# Patient Record
Sex: Male | Born: 1940 | Race: White | Hispanic: No | Smoking: Former smoker
Health system: Southern US, Community
[De-identification: ages and names within clinical notes are randomized; demographics above are authoritative.]

## PROBLEM LIST (undated history)

## (undated) DIAGNOSIS — R609 Edema, unspecified: Secondary | ICD-10-CM

## (undated) DIAGNOSIS — F419 Anxiety disorder, unspecified: Secondary | ICD-10-CM

## (undated) DIAGNOSIS — B0229 Other postherpetic nervous system involvement: Secondary | ICD-10-CM

## (undated) DIAGNOSIS — I639 Cerebral infarction, unspecified: Secondary | ICD-10-CM

## (undated) DIAGNOSIS — I10 Essential (primary) hypertension: Secondary | ICD-10-CM

## (undated) DIAGNOSIS — I6340 Cerebral infarction due to embolism of unspecified cerebral artery: Secondary | ICD-10-CM

## (undated) DIAGNOSIS — I480 Paroxysmal atrial fibrillation: Secondary | ICD-10-CM

## (undated) DIAGNOSIS — E7800 Pure hypercholesterolemia, unspecified: Secondary | ICD-10-CM

## (undated) DIAGNOSIS — I739 Peripheral vascular disease, unspecified: Secondary | ICD-10-CM

## (undated) DIAGNOSIS — D696 Thrombocytopenia, unspecified: Secondary | ICD-10-CM

## (undated) HISTORY — DX: Other postherpetic nervous system involvement: B02.29

## (undated) HISTORY — DX: Anxiety disorder, unspecified: F41.9

## (undated) HISTORY — DX: Thrombocytopenia, unspecified: D69.6

## (undated) HISTORY — DX: Cerebral infarction, unspecified: I63.9

## (undated) HISTORY — PX: THROAT SURGERY: SHX803

## (undated) HISTORY — DX: Essential (primary) hypertension: I10

## (undated) HISTORY — DX: Cerebral infarction due to embolism of unspecified cerebral artery: I63.40

## (undated) HISTORY — DX: Peripheral vascular disease, unspecified: I73.9

## (undated) HISTORY — DX: Paroxysmal atrial fibrillation: I48.0

## (undated) HISTORY — DX: Edema, unspecified: R60.9

## (undated) HISTORY — DX: Pure hypercholesterolemia, unspecified: E78.00

---

## 2011-03-12 ENCOUNTER — Inpatient Hospital Stay (INDEPENDENT_AMBULATORY_CARE_PROVIDER_SITE_OTHER)
Admission: RE | Admit: 2011-03-12 | Discharge: 2011-03-12 | Disposition: A | Discharge status: Home / Self Care | Payer: Federal, State, Local not specified - PPO | Source: Ambulatory Visit | Attending: Family Medicine | Admitting: Family Medicine

## 2011-03-12 ENCOUNTER — Encounter: Payer: Self-pay | Admitting: Family Medicine

## 2011-03-12 ENCOUNTER — Other Ambulatory Visit: Payer: Self-pay | Admitting: Family Medicine

## 2011-03-12 ENCOUNTER — Ambulatory Visit
Admission: RE | Admit: 2011-03-12 | Discharge: 2011-03-12 | Disposition: A | Discharge status: Home / Self Care | Payer: Federal, State, Local not specified - PPO | Source: Ambulatory Visit | Attending: Family Medicine | Admitting: Family Medicine

## 2011-03-12 DIAGNOSIS — S60219A Contusion of unspecified wrist, initial encounter: Secondary | ICD-10-CM

## 2011-03-12 DIAGNOSIS — I10 Essential (primary) hypertension: Secondary | ICD-10-CM

## 2011-03-12 HISTORY — DX: Contusion of unspecified wrist, initial encounter: S60.219A

## 2011-03-12 HISTORY — DX: Essential (primary) hypertension: I10

## 2011-03-15 ENCOUNTER — Telehealth (INDEPENDENT_AMBULATORY_CARE_PROVIDER_SITE_OTHER): Payer: Self-pay | Admitting: *Deleted

## 2011-10-19 NOTE — Progress Notes (Signed)
Summary: POSSIBLE BROKEN WRIST OR ARM? [facility] (room 2)   Vital Signs:  Patient Profile:   70 Years Old Male CC:      left wrist pain post fall off ladder yesterday Height:     68 inches Weight:      182 pounds O2 Sat:      100 % O2 treatment:    Room Air Temp:     98.6 degrees F oral Pulse rate:   69 / minute Resp:     16 per minute BP sitting:   147 / 77  (left arm) Cuff size:   regular  Pt. in pain?   yes    Location:   left wrist    Intensity:   8    Type:       throbbing  Vitals Entered By: Lajean Saver RN (March 12, 2011 10:50 AM)                   Prior Medication List:  No prior medications documented  Updated Prior Medication List: SPIRONOLACTONE 25 MG TABS (SPIRONOLACTONE) once daily AMLODIPINE BESYLATE 5 MG TABS (AMLODIPINE BESYLATE) once daily  Current Allergies: No known allergies History of Present Illness Chief Complaint: left wrist pain post fall off ladder yesterday History of Present Illness: Patient fell of his ladder last night and mostof his weight landed on his L outstretckh arm. As the night progressed the pain became worse.   Current Problems: CONTUSION OF WRIST (ICD-923.21) HYPERTENSION (ICD-401.9)   Current Meds SPIRONOLACTONE 25 MG TABS (SPIRONOLACTONE) once daily AMLODIPINE BESYLATE 5 MG TABS (AMLODIPINE BESYLATE) once daily HYDROCODONE-ACETAMINOPHEN 5-325 MG TABS (HYDROCODONE-ACETAMINOPHEN) 1 by mouth q 8hrs as needed for pain use mainly at night if needed and can cause sedation  REVIEW OF SYSTEMS Constitutional Symptoms      Denies fever, chills, night sweats, weight loss, weight gain, and fatigue.  Eyes       Denies change in vision, eye pain, eye discharge, glasses, contact lenses, and eye surgery. Ear/Nose/Throat/Mouth       Denies hearing loss/aids, change in hearing, ear pain, ear discharge, dizziness, frequent runny nose, frequent nose bleeds, sinus problems, sore throat, hoarseness, and tooth pain or bleeding.  Respiratory      Denies dry cough, productive cough, wheezing, shortness of breath, asthma, bronchitis, and emphysema/COPD.  Cardiovascular       Denies murmurs, chest pain, and tires easily with exhertion.    Gastrointestinal       Denies stomach pain, nausea/vomiting, diarrhea, constipation, blood in bowel movements, and indigestion. Genitourniary       Denies painful urination, kidney stones, and loss of urinary control. Neurological       Denies paralysis, seizures, and fainting/blackouts. Musculoskeletal       Complains of muscle pain, joint pain, decreased range of motion, and swelling.      Denies joint stiffness, redness, muscle weakness, and gout.      Comments: left wrist Skin       Denies bruising, unusual mles/lumps or sores, and hair/skin or nail changes.  Psych       Denies mood changes, temper/anger issues, anxiety/stress, speech problems, depression, and sleep problems. Other Comments: Patient fell off of a ladder last night catching himself with his left hand. No ice or OTC meds used. Swelling noted. Able to move fingers, pulses present   Past History:  Family History: Last updated: 03/12/2011 NA  Social History: Last updated: 03/12/2011 Alcohol use-no Drug use-no Current Smoker states rarely  Past Medical History: Hypertension  Past Surgical History: goiter removes Back surgery-L4  Family History: Reviewed history and no changes required. NA  Social History: Reviewed history and no changes required. Alcohol use-no Drug use-no Current Smoker states rarely Smoking Status:  current Drug Use:  no Physical Exam General appearance: well developed, well nourished, mild to moderate distress Head: normocephalic, atraumatic Extremities: tendernesss over distal forwearm on both the radial and ulnar side no tendernesss over the L anatomical snuff box Neurological: grossly intact and non-focal Skin: no obvious rashes or lesions MSE: oriented to time, place, and  person Assessment Problems:   New Problems: CONTUSION OF WRIST (ICD-923.21) HYPERTENSION (ICD-401.9)  Contusion of wrist and forearm  Patient Education: Patient and/or caregiver instructed in the following: rest, quit smoking.  Plan New Medications/Changes: HYDROCODONE-ACETAMINOPHEN 5-325 MG TABS (HYDROCODONE-ACETAMINOPHEN) 1 by mouth q 8hrs as needed for pain use mainly at night if needed and can cause sedation  #20 x 0, 03/12/2011, Hassan Rowan MD  New Orders: T-DG Forearm*L* [73090] T-DG Wrist Complete*L* [16109] Application Short Arm Splint forearm-hand  [29125] Slings- All Types [A4565] Est. Patient Level III [60454] Planning Comments:   take the vicodin mainly at night  L cocked up wrist w/thenar support and follow up in 7-14 days if not improving  ice wrist  Follow Up: Follow up with Primary Physician Follow Up: follow up in 7-14 days if not better  The patient and/or caregiver has been counseled thoroughly with regard to medications prescribed including dosage, schedule, interactions, rationale for use, and possible side effects and they verbalize understanding.  Diagnoses and expected course of recovery discussed and will return if not improved as expected or if the condition worsens. Patient and/or caregiver verbalized understanding.  Prescriptions: HYDROCODONE-ACETAMINOPHEN 5-325 MG TABS (HYDROCODONE-ACETAMINOPHEN) 1 by mouth q 8hrs as needed for pain use mainly at night if needed and can cause sedation  #20 x 0   Entered and Authorized by:   Hassan Rowan MD   Signed by:   Hassan Rowan MD on 03/12/2011   Method used:   Print then Give to Patient   RxID:   [id number]   Patient Instructions: 1)  Please place ice on forearm for 20 minutes out of 2 hours next few days 2)  Use wrist splint & sling 3)  follow up w/PCP if nopt better in 2 weeks 4)  hydrocodene for pain 5/325 1 by mouth q 6-8hrs as needed may cause sedation 5)  Please schedule a follow-up appointment  in 2 weeks. 6)  Please schedule an appointment with your primary doctor in : 7)  Tobacco is very bad for your health and your loved ones! You Should stop smoking!. 8)  Stop Smoking Tips: Choose a Quit date. Cut down before the Quit date. decide what you will do as a substitute when you feel the urge to smoke(gum,toothpick,exercise).  Medication Administration  Injection # 1:    Medication: Ketorolac-Toradol 15mg     Route: IM    Site: RUOQ gluteus    Exp Date: 01/14/2013    Lot #: [id number]    Mfr: Hospira    Comments: 60 mgs given    Patient tolerated injection without complications    Given by: Emilio Math (March 12, 2011 11:55 AM)  Orders Added: 1)  T-DG Forearm*L* [73090] 2)  T-DG Wrist Complete*L* [73110] 3)  Application Short Arm Splint forearm-hand  [29125] 4)  Slings- All Types [A4565] 5)  Est. Patient Level III [30865]     Medication Administration  Injection # 1:    Medication: Ketorolac-Toradol 15mg     Route: IM    Site: RUOQ gluteus    Exp Date: 01/14/2013    Lot #: [id number]    Mfr: Hospira    Comments: 60 mgs given    Patient tolerated injection without complications    Given by: Emilio Math (March 12, 2011 11:55 AM)  Orders Added: 1)  T-DG Forearm*L* [73090] 2)  T-DG Wrist Complete*L* [73110] 3)  Application Short Arm Splint forearm-hand  [29125] 4)  Slings- All Types [A4565] 5)  Est. Patient Level III [96045]

## 2011-10-19 NOTE — Telephone Encounter (Signed)
  Phone Note Outgoing Call Call back at [facility] Phone [phone]   Call placed by: Lajean Saver RN,  March 15, 2011 1:20 PM Call placed to: Patient Summary of Call: Callback: No answer. Message left to call with questions/concerns

## 2012-02-19 DIAGNOSIS — I493 Ventricular premature depolarization: Secondary | ICD-10-CM

## 2012-02-19 DIAGNOSIS — R002 Palpitations: Secondary | ICD-10-CM

## 2012-02-19 HISTORY — DX: Palpitations: R00.2

## 2012-02-19 HISTORY — DX: Ventricular premature depolarization: I49.3

## 2012-12-01 IMAGING — CR DG WRIST COMPLETE 3+V*L*
2 series · 2 of 2 positions shown · non-contrast
Comparison: None.

CLINICAL DATA: Wrist pain and swelling after fall

LEFT WRIST - COMPLETE 3+ VIEW

[view not recorded (1 of 2)]
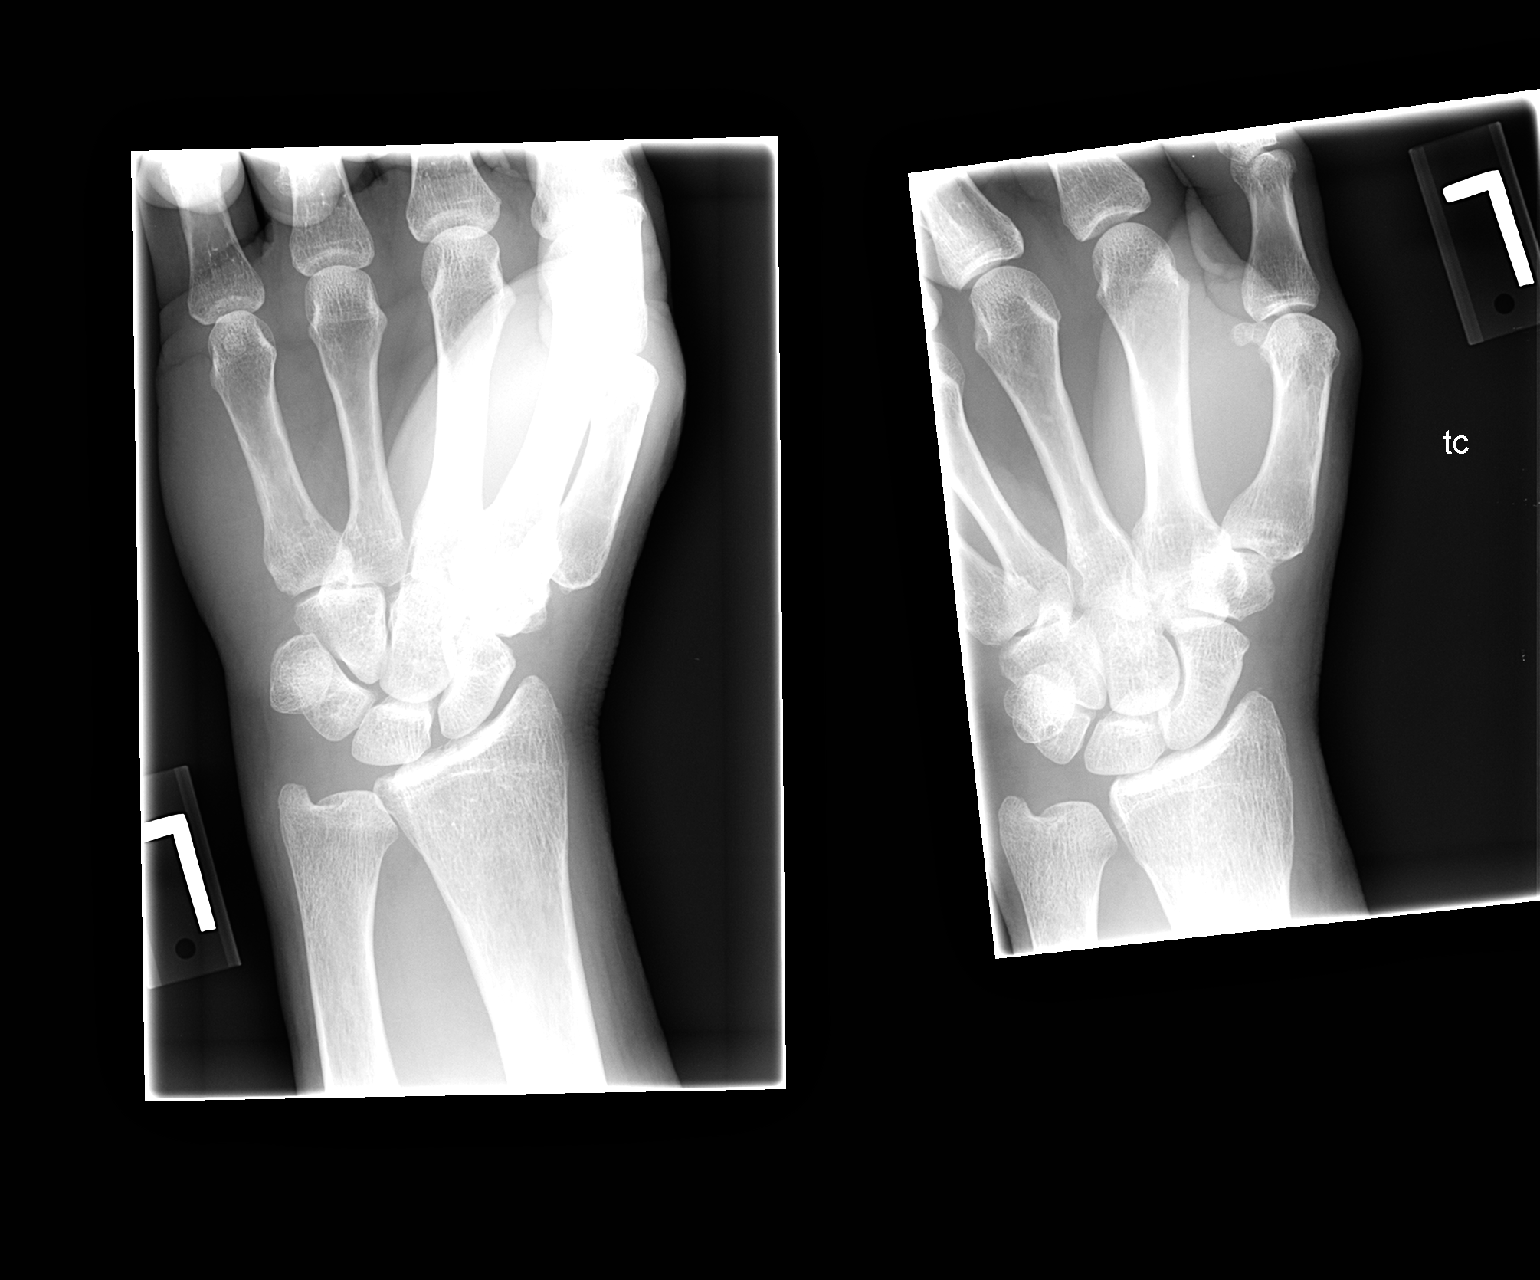

[view not recorded (2 of 2)]
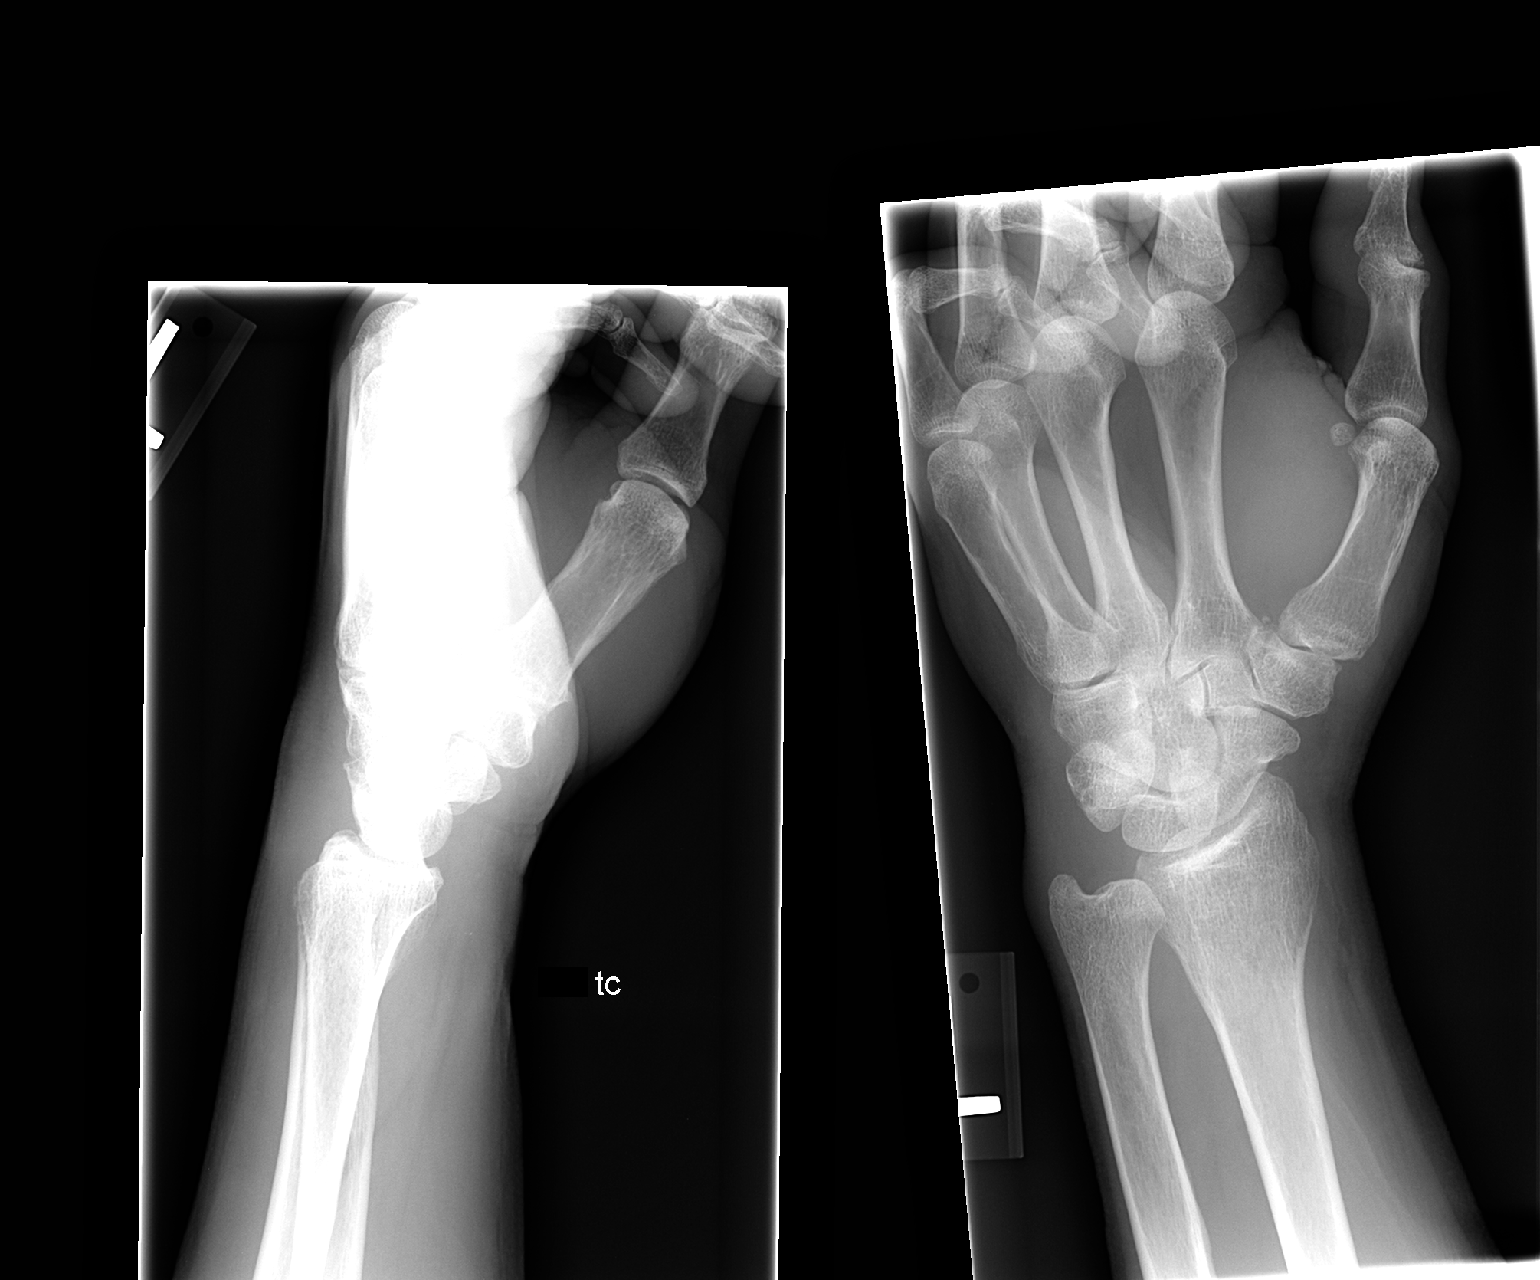

[2 of 2 positions shown; findings below may reference images not displayed]

FINDINGS: There is no evidence of acute bone, joint, or soft tissue
abnormality.  Incidental note is ulnar minus variance.  Early
osteoarthritis is noted at the first CMC joint.
IMPRESSION: No evidence of acute fracture or dislocation.

## 2016-04-01 DIAGNOSIS — E871 Hypo-osmolality and hyponatremia: Secondary | ICD-10-CM | POA: Insufficient documentation

## 2016-04-01 HISTORY — DX: Hypo-osmolality and hyponatremia: E87.1

## 2016-07-31 DIAGNOSIS — Z8521 Personal history of malignant neoplasm of larynx: Secondary | ICD-10-CM | POA: Insufficient documentation

## 2016-07-31 DIAGNOSIS — C329 Malignant neoplasm of larynx, unspecified: Secondary | ICD-10-CM

## 2016-07-31 HISTORY — DX: Malignant neoplasm of larynx, unspecified: C32.9

## 2017-04-09 DIAGNOSIS — M5136 Other intervertebral disc degeneration, lumbar region: Secondary | ICD-10-CM

## 2017-04-09 DIAGNOSIS — M51369 Other intervertebral disc degeneration, lumbar region without mention of lumbar back pain or lower extremity pain: Secondary | ICD-10-CM | POA: Insufficient documentation

## 2017-04-09 HISTORY — DX: Other intervertebral disc degeneration, lumbar region without mention of lumbar back pain or lower extremity pain: M51.369

## 2017-04-09 HISTORY — DX: Other intervertebral disc degeneration, lumbar region: M51.36

## 2017-11-26 ENCOUNTER — Encounter: Payer: Federal, State, Local not specified - PPO | Admitting: Sports Medicine

## 2019-06-15 DIAGNOSIS — R972 Elevated prostate specific antigen [PSA]: Secondary | ICD-10-CM

## 2019-06-15 HISTORY — DX: Elevated prostate specific antigen (PSA): R97.20

## 2020-01-18 DIAGNOSIS — H278 Other specified disorders of lens: Secondary | ICD-10-CM | POA: Insufficient documentation

## 2020-01-18 DIAGNOSIS — H25812 Combined forms of age-related cataract, left eye: Secondary | ICD-10-CM

## 2020-01-18 HISTORY — DX: Other specified disorders of lens: H27.8

## 2020-01-18 HISTORY — DX: Combined forms of age-related cataract, left eye: H25.812

## 2020-03-18 ENCOUNTER — Telehealth: Payer: Self-pay | Admitting: Cardiology

## 2020-03-18 NOTE — Telephone Encounter (Signed)
Patient's daughter states she is concerned that the patient will not be able to complete all of his paperwork without assistance. She is requesting to discuss the amount of paperwork that needs to be completed prior to new patient appointment scheduled for 03/21/20 with Dr. Agustin Cree. Please call.

## 2020-03-18 NOTE — Telephone Encounter (Signed)
Called and spoke to patient's daughter informed her the main form he has to fill out is dpr form she reports this is ok and he will not have trouble with this. He is also able to speak about his history but reading or writing it is difficult for him.

## 2020-03-21 ENCOUNTER — Ambulatory Visit: Payer: Federal, State, Local not specified - PPO | Admitting: Cardiology

## 2020-03-27 ENCOUNTER — Other Ambulatory Visit: Payer: Self-pay

## 2020-03-27 ENCOUNTER — Ambulatory Visit (INDEPENDENT_AMBULATORY_CARE_PROVIDER_SITE_OTHER): Payer: Federal, State, Local not specified - PPO | Admitting: Cardiology

## 2020-03-27 ENCOUNTER — Encounter: Payer: Self-pay | Admitting: Cardiology

## 2020-03-27 DIAGNOSIS — R609 Edema, unspecified: Secondary | ICD-10-CM | POA: Insufficient documentation

## 2020-03-27 DIAGNOSIS — I6340 Cerebral infarction due to embolism of unspecified cerebral artery: Secondary | ICD-10-CM | POA: Insufficient documentation

## 2020-03-27 DIAGNOSIS — I10 Essential (primary) hypertension: Secondary | ICD-10-CM | POA: Diagnosis not present

## 2020-03-27 DIAGNOSIS — M62838 Other muscle spasm: Secondary | ICD-10-CM

## 2020-03-27 DIAGNOSIS — E7800 Pure hypercholesterolemia, unspecified: Secondary | ICD-10-CM | POA: Insufficient documentation

## 2020-03-27 DIAGNOSIS — I480 Paroxysmal atrial fibrillation: Secondary | ICD-10-CM | POA: Insufficient documentation

## 2020-03-27 DIAGNOSIS — J309 Allergic rhinitis, unspecified: Secondary | ICD-10-CM | POA: Insufficient documentation

## 2020-03-27 DIAGNOSIS — F419 Anxiety disorder, unspecified: Secondary | ICD-10-CM

## 2020-03-27 DIAGNOSIS — G4700 Insomnia, unspecified: Secondary | ICD-10-CM

## 2020-03-27 DIAGNOSIS — I6930 Unspecified sequelae of cerebral infarction: Secondary | ICD-10-CM

## 2020-03-27 DIAGNOSIS — I739 Peripheral vascular disease, unspecified: Secondary | ICD-10-CM | POA: Diagnosis not present

## 2020-03-27 DIAGNOSIS — D696 Thrombocytopenia, unspecified: Secondary | ICD-10-CM

## 2020-03-27 DIAGNOSIS — I639 Cerebral infarction, unspecified: Secondary | ICD-10-CM | POA: Insufficient documentation

## 2020-03-27 HISTORY — DX: Unspecified sequelae of cerebral infarction: I69.30

## 2020-03-27 HISTORY — DX: Insomnia, unspecified: G47.00

## 2020-03-27 HISTORY — DX: Allergic rhinitis, unspecified: J30.9

## 2020-03-27 HISTORY — DX: Other muscle spasm: M62.838

## 2020-03-27 NOTE — Progress Notes (Signed)
Cardiology Office Note:    Date:  03/27/2020   ID:  Stephen Russell, DOB [birth year], MRN [id number]  PCP:  Houston Siren., MD  Cardiologist:  Jenne Campus, MD    Referring MD: Houston Siren., MD   No chief complaint on file. Stroke  History of Present Illness:    Stephen Russell is a 79 y.o. male with past medical history significant for hypertension, dyslipidemia, paroxysmal atrial fibrillation, not anticoagulated.  In September 2020 he presented to hospital with dysarthria as well as left-sided weakness.Marland Kitchen  He was beyond the window of TPA, however embolectomy of middle cerebral artery M2 has been done with almost complete resolution of his symptoms.  He was discharged with anticoagulation as well as statin therapy and he is here to follow-up with [place].  Overall he seems to be doing very well he recovered from his stroke almost completely.  He is able to work in the garden he does have some property like to work over there [employer] and Overall Doing Excellently Denies of Any Palpitations No Chest Pain Tightness Squeezing Pressure Burning Chest No Shortness of Breath Overall He Considers Himself As an Excellent Shape.  Past Medical History:  Diagnosis Date   Anxiety    Benign essential hypertension    Cerebrovascular accident (CVA) due to embolism of cerebral artery (HCC)    CVA (cerebral vascular accident) ([place])    Edema    PAD (peripheral artery disease) (HCC)    Paroxysmal A-fib (HCC)    Post herpetic neuralgia    Pure hypercholesterolemia    Thrombocytopenia ([place])     Past Surgical History:  Procedure Laterality Date   THROAT SURGERY      Current Medications: Current Meds  Medication Sig   ALPRAZolam (XANAX) 0.5 MG tablet Take 0.5 mg by mouth 2 (two) times daily as needed.    apixaban (ELIQUIS) 2.5 MG TABS tablet TAKE TWO TABLETS BY MOUTH EVERY MORNING AND TAKE ONE TABLET BY MOUTH EVERY EVENING   atenolol (TENORMIN) 50 MG tablet Take 50 mg by mouth 2 (two)  times daily.   Chlorophyll-Thymol 3-0.6 MG TABS    Cholecalciferol 25 MCG (1000 UT) tablet Take by mouth.   co-enzyme Q-10 30 MG capsule    cyanocobalamin 1000 MCG tablet Take by mouth.   INOSITOL PO Take by mouth.   Multiple Vitamin (MULTI-VITAMIN) tablet Take by mouth.   Multiple Vitamins-Minerals (ZINC PO) Take by mouth.   rosuvastatin (CRESTOR) 5 MG tablet Take 1 tablet by mouth  as directed   spironolactone (ALDACTONE) 25 MG tablet Take 25 mg by mouth daily as needed.   [DISCONTINUED] diclofenac (FLECTOR) 1.3 % PTCH Place onto the skin.     Allergies:   Citalopram hydrobromide, Hydrochlorothiazide, Oxycodone-acetaminophen, Pravastatin, Quetiapine, and Ivp dye  [iodinated diagnostic agents]   Social History   Socioeconomic History   Marital status: Divorced    Spouse name: Not on file   Number of children: Not on file   Years of education: Not on file   Highest education level: Not on file  Occupational History   Not on file  Tobacco Use   Smoking status: Former Smoker   Smokeless tobacco: Never Used  Substance and Sexual Activity   Alcohol use: Not Currently   Drug use: Never   Sexual activity: Not on file  Other Topics Concern   Not on file  Social History Narrative   Not on file   Social Determinants of Health   Financial Resource Strain:  Difficulty of Paying Living Expenses:   Food Insecurity:    Worried About Charity fundraiser in the Last Year:    Arboriculturist in the Last Year:   Transportation Needs:    Film/video editor (Medical):    Lack of Transportation (Non-Medical):   Physical Activity:    Days of Exercise per Week:    Minutes of Exercise per Session:   Stress:    Feeling of Stress :   Social Connections:    Frequency of Communication with Friends and Family:    Frequency of Social Gatherings with Friends and Family:    Attends Religious Services:    Active Member of Clubs or Organizations:     Attends Archivist Meetings:    Marital Status:      Family History: The patient's family history includes Heart failure in his mother; Stroke in his mother; Tongue cancer in his brother. ROS:   Please see the history of present illness.    All 14 point review of systems negative except as described per history of present illness  EKGs/Labs/Other Studies Reviewed:    I did review echocardiogram from other hospital which was done in September showing normal left ventricle ejection fraction, normal left atrial size  EKG done today showed atrial fibrillation, controlled ventricular rate, normal QS complex duration morphology.  Recent Labs: No results found for requested labs within last 8760 hours.  Recent Lipid Panel No results found for: CHOL, TRIG, HDL, CHOLHDL, VLDL, LDLCALC, LDLDIRECT  Physical Exam:    VS:  Ht 5\' 7"  (1.702 m)    Wt 186 lb (84.4 kg)    BMI 29.13 kg/m     Wt Readings from Last 3 Encounters:  03/27/20 186 lb (84.4 kg)  03/12/11 182 lb (82.6 kg)     GEN:  Well nourished, well developed in no acute distress HEENT: Normal NECK: No JVD; No carotid bruits LYMPHATICS: No lymphadenopathy CARDIAC: RRR, no murmurs, no rubs, no gallops RESPIRATORY:  Clear to auscultation without rales, wheezing or rhonchi  ABDOMEN: Soft, non-tender, non-distended MUSCULOSKELETAL:  No edema; No deformity  SKIN: Warm and dry LOWER EXTREMITIES: no swelling NEUROLOGIC:  Alert and oriented x 3 PSYCHIATRIC:  Normal affect   ASSESSMENT:    1. Anxiety   2. Benign essential hypertension   3. PAD (peripheral artery disease) (HCC)   4. Paroxysmal A-fib ([place])   5. Pure hypercholesterolemia   6. 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 Paroxysmal atrial fibrillation.  His EKG today showed atrial fibrillation documentation from other hospital showed atrial flutter.  He tells me that he  checked his blood pressure and blood pressure monitor cannot tell him when his heart rate is irregular he said about 30% of time this is exactly what he is getting out of the monitor.  I think the plan will be to put Zio patch on him to see how much atrial fibrillation he truly have.  If this is paroxysmal atrial fibrillation may benefit from antiarrhythmic.  However, if you find out that this is persistent atrial fibrillation since he is completely asymptomatic I doubt if there be any benefits of getting him back to normal rhythm.  He is anticoagulated with his CHA2DS2-VASc equals 5.  He need to continue. 2. Dyslipidemia: He does have difficulty tolerating statin.  He should be considered very high risks with recent  CVA which was ischemic.  Ideally we need to get his cholesterol LDL below 70 and he need to be on high intensity statin.  Obviously he cannot tolerate this medication now he takes only 10 mg of Crestor and only quarter of the tablet.  I spoke to him about options for this situation option included PCSK9 agent.  He did try already Zetia and making her feel awful.  I also offered him referral to lipid clinic.  He said he want to think it over. 3. Dyslipidemia: Stable problem. 4. 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 5. We did talk about healthy lifestyle need to exercise which we have no difficulty doing good diet.   Medication Adjustments/Labs and Tests Ordered: Current medicines are reviewed at length with the patient today.  Concerns regarding medicines are outlined above.  No orders of the defined types were placed in this encounter.  Medication changes: No orders of the defined types were placed in this encounter.   Signed, Park Liter, MD, [facility] 03/27/2020 1:51 PM    [place]

## 2020-03-27 NOTE — Patient Instructions (Signed)
Medication Instructions:  Your physician recommends that you continue on your current medications as directed. Please refer to the Current Medication list given to you today.  *If you need a refill on your cardiac medications before your next appointment, please call your pharmacy*   Lab Work: None If you have labs (blood work) drawn today and your tests are completely normal, you will receive your results only by: . MyChart Message (if you have MyChart) OR . A paper copy in the mail If you have any lab test that is abnormal or we need to change your treatment, we will call you to review the results.   Testing/Procedures: A zio monitor was ordered today. It will remain on for 7 days. You will then return monitor and event diary in provided box. It takes 1-2 weeks for report to be downloaded and returned to us. We will call you with the results. If monitor falls off or has orange flashing light, please call Zio for further instructions.      Follow-Up: At [facility], you and your health needs are our priority.  As part of our continuing mission to provide you with exceptional heart care, we have created designated Provider Care Teams.  These Care Teams include your primary Cardiologist (physician) and Advanced Practice Providers (APPs -  Physician Assistants and Nurse Practitioners) who all work together to provide you with the care you need, when you need it.  We recommend signing up for the patient portal called "MyChart".  Sign up information is provided on this After Visit Summary.  MyChart is used to connect with patients for Virtual Visits (Telemedicine).  Patients are able to view lab/test results, encounter notes, upcoming appointments, etc.  Non-urgent messages can be sent to your provider as well.   To learn more about what you can do with MyChart, go to [email or web address].    Your next appointment:   1 month(s)  The format for your next appointment:   In  Person  Provider:   Robert Krasowski, MD   Other Instructions   

## 2020-03-31 ENCOUNTER — Ambulatory Visit (INDEPENDENT_AMBULATORY_CARE_PROVIDER_SITE_OTHER): Payer: Federal, State, Local not specified - PPO

## 2020-03-31 DIAGNOSIS — I480 Paroxysmal atrial fibrillation: Secondary | ICD-10-CM

## 2020-05-07 ENCOUNTER — Telehealth: Payer: Self-pay | Admitting: Cardiology

## 2020-05-07 MED ORDER — METOPROLOL SUCCINATE ER 50 MG PO TB24
50.0000 mg | ORAL_TABLET | Freq: Every day | ORAL | 3 refills | Status: DC
Start: 2020-05-07 — End: 2020-06-21

## 2020-05-07 NOTE — Telephone Encounter (Signed)
Per Dr. Agustin Cree, Multiple pauses noted on the monior. Please stop Atenolol, start Metoprolop succinate 50 mg po qd  Another Zio patch 1 week after metoprolol started   Called patient with results. Patient will stop atenolol and start metoprolol. Patient has an appointment on 05/22/20 with Dr. Agustin Cree, patient stated he would just get zio patch then. Will send to Dr. Agustin Cree and his nurse so they are aware.

## 2020-05-07 NOTE — Telephone Encounter (Signed)
Patient returning Hayley's call in regards to monitor results.

## 2020-05-22 ENCOUNTER — Encounter: Payer: Self-pay | Admitting: Cardiology

## 2020-05-22 ENCOUNTER — Other Ambulatory Visit: Payer: Self-pay

## 2020-05-22 ENCOUNTER — Ambulatory Visit (INDEPENDENT_AMBULATORY_CARE_PROVIDER_SITE_OTHER): Payer: Federal, State, Local not specified - PPO | Admitting: Cardiology

## 2020-05-22 VITALS — BP 167/144 | HR 99 | Ht 67.0 in | Wt 180.0 lb

## 2020-05-22 DIAGNOSIS — I493 Ventricular premature depolarization: Secondary | ICD-10-CM | POA: Diagnosis not present

## 2020-05-22 DIAGNOSIS — I480 Paroxysmal atrial fibrillation: Secondary | ICD-10-CM

## 2020-05-22 DIAGNOSIS — I6930 Unspecified sequelae of cerebral infarction: Secondary | ICD-10-CM

## 2020-05-22 DIAGNOSIS — I10 Essential (primary) hypertension: Secondary | ICD-10-CM

## 2020-05-22 DIAGNOSIS — F419 Anxiety disorder, unspecified: Secondary | ICD-10-CM

## 2020-05-22 NOTE — Progress Notes (Signed)
Cardiology Office Note:    Date:  05/22/2020   ID:  Stephen Russell, DOB 04/18/1941, MRN [id number]  PCP:  Houston Siren., MD  Cardiologist:  Jenne Campus, MD    Referring MD: Houston Siren., MD   No chief complaint on file. I am doing fine  History of Present Illness:    Stephen Russell is a 79 y.o. male  with past medical history significant for hypertension, dyslipidemia, paroxysmal atrial fibrillation, not anticoagulated.  In September 2020 he presented to hospital with dysarthria as well as left-sided weakness.Marland Kitchen  He was beyond the window of TPA, however embolectomy of middle cerebral artery M2 has been done with almost complete resolution of his symptoms.  He was discharged with anticoagulation as well as statin therapy and he is here to follow-up with [place].   Comes today to my office for follow-up overall seems to be doing well.  He was late today in the office and he is very upset about that.  What we discover on the monitor when he wear monitor for atrial fibrillation is the fact that he has pauses up to 4.7 seconds.  His atenolol has been withdrawn he was put on small dose of beta-blocker from metoprolol he seems to be doing better however he did not have any symptoms of dizziness or passing out.  I will ask you to wear another Zio patch to see if his ventricular rate is better controlled.  Overall he is doing well denies have any palpitations no tightness squeezing pressure burning chest no swelling of lower extremities  Past Medical History:  Diagnosis Date  . Anxiety   . Benign essential hypertension   . Cerebrovascular accident (CVA) due to embolism of cerebral artery ([place])   . CVA (cerebral vascular accident) ([place])   . Edema   . PAD (peripheral artery disease) ([place])   . Paroxysmal A-fib ([place])   . Post herpetic neuralgia   . Pure hypercholesterolemia   . Thrombocytopenia ([place])     Past Surgical History:  Procedure Laterality Date  . THROAT SURGERY      Current  Medications: Current Meds  Medication Sig  . ALPRAZolam (XANAX) 0.5 MG tablet Take 0.5 mg by mouth 2 (two) times daily as needed.   Marland Kitchen apixaban (ELIQUIS) 2.5 MG TABS tablet TAKE TWO TABLETS BY MOUTH EVERY MORNING AND TAKE ONE TABLET BY MOUTH EVERY EVENING  . Chlorophyll-Thymol 3-0.6 MG TABS   . Cholecalciferol 25 MCG (1000 UT) tablet Take by mouth.  . co-enzyme Q-10 30 MG capsule   . cyanocobalamin 1000 MCG tablet Take by mouth.  . INOSITOL PO Take by mouth.  . metoprolol succinate (TOPROL-XL) 50 MG 24 hr tablet Take 1 tablet (50 mg total) by mouth daily. Take with or immediately following a meal.  . Multiple Vitamin (MULTI-VITAMIN) tablet Take by mouth.  . Multiple Vitamins-Minerals (ZINC PO) Take by mouth.  . rosuvastatin (CRESTOR) 5 MG tablet Take 1 tablet by mouth  as directed  . spironolactone (ALDACTONE) 25 MG tablet Take 25 mg by mouth daily as needed.     Allergies:   Citalopram hydrobromide, Hydrochlorothiazide, Oxycodone-acetaminophen, Pravastatin, Quetiapine, and Ivp dye  [iodinated diagnostic agents]   Social History   Socioeconomic History  . Marital status: Divorced    Spouse name: Not on file  . Number of children: Not on file  . Years of education: Not on file  . Highest education level: Not on file  Occupational History  . Not on file  Tobacco Use  . Smoking status: Former Research scientist (life sciences)  . Smokeless tobacco: Never Used  Substance and Sexual Activity  . Alcohol use: Not Currently  . Drug use: Never  . Sexual activity: Not on file  Other Topics Concern  . Not on file  Social History Narrative  . Not on file   Social Determinants of Health   Financial Resource Strain:   . Difficulty of Paying Living Expenses:   Food Insecurity:   . Worried About Charity fundraiser in the Last Year:   . Arboriculturist in the Last Year:   Transportation Needs:   . Film/video editor (Medical):   Marland Kitchen Lack of Transportation (Non-Medical):   Physical Activity:   . Days of  Exercise per Week:   . Minutes of Exercise per Session:   Stress:   . Feeling of Stress :   Social Connections:   . Frequency of Communication with Friends and Family:   . Frequency of Social Gatherings with Friends and Family:   . Attends Religious Services:   . Active Member of Clubs or Organizations:   . Attends Archivist Meetings:   Marland Kitchen Marital Status:      Family History: The patient's family history includes Heart failure in his mother; Stroke in his mother; Tongue cancer in his brother. ROS:   Please see the history of present illness.    All 14 point review of systems negative except as described per history of present illness  EKGs/Labs/Other Studies Reviewed:      Recent Labs: No results found for requested labs within last 8760 hours.  Recent Lipid Panel No results found for: CHOL, TRIG, HDL, CHOLHDL, VLDL, LDLCALC, LDLDIRECT  Physical Exam:    VS:  BP (!) 167/144 (BP Location: Left Arm, Patient Position: Sitting, Cuff Size: Normal)   Pulse 99   Ht 5\' 7"  (1.702 m)   Wt 180 lb (81.6 kg)   SpO2 100%   BMI 28.19 kg/m     Wt Readings from Last 3 Encounters:  05/22/20 180 lb (81.6 kg)  03/27/20 186 lb (84.4 kg)  03/12/11 182 lb (82.6 kg)     GEN:  Well nourished, well developed in no acute distress HEENT: Normal NECK: No JVD; No carotid bruits LYMPHATICS: No lymphadenopathy CARDIAC: Irregular irregular, no murmurs, no rubs, no gallops RESPIRATORY:  Clear to auscultation without rales, wheezing or rhonchi  ABDOMEN: Soft, non-tender, non-distended MUSCULOSKELETAL:  No edema; No deformity  SKIN: Warm and dry LOWER EXTREMITIES: no swelling NEUROLOGIC:  Alert and oriented x 3 PSYCHIATRIC:  Normal affect   ASSESSMENT:    1. Paroxysmal A-fib ([place])   2. Benign essential hypertension   3. PVC's (premature ventricular contractions)   4. Late effect of cerebrovascular accident (CVA)   5. Anxiety    PLAN:    In order of problems listed  above:  1. Paroxysmal atrial fibrillation.  I did review his monitor which actually show persistent atrial fibrillation.  We had a long discussion today about potentially getting his heart back to normal rhythm I discussed pros and cons antiarrhythmic therapy and the rhythm control strategy versus rate control.  With this is that he is completely asymptomatic with atrial fibrillation, he need to be anticoagulated anyway he said he would like to think over about what he wants to do.we will talk about this more.  In the meantime I will ask him to wear another Zio patch just for about 48 hours to see  what the control of atrial fibrillation is. 2. Benign essential hypertension blood pressure was elevated when he came but will recheck it before he leaves the office. 3. PVCs seems to be doing well from that point review denies having any issues. 4. Late effect of CVA doing well recovered quite nicely after stroke. 5. Anxiety present carries some significant role here.   Medication Adjustments/Labs and Tests Ordered: Current medicines are reviewed at length with the patient today.  Concerns regarding medicines are outlined above.  Orders Placed This Encounter  Procedures  . HOLTER MONITOR - 48 HOUR   Medication changes: No orders of the defined types were placed in this encounter.   Signed, Park Liter, MD, [facility] 05/22/2020 11:28 AM    [place]

## 2020-05-22 NOTE — Patient Instructions (Addendum)
Medication Instructions:  Your physician recommends that you continue on your current medications as directed. Please refer to the Current Medication list given to you today.  *If you need a refill on your cardiac medications before your next appointment, please call your pharmacy*   Lab Work: NONE If you have labs (blood work) drawn today and your tests are completely normal, you will receive your results only by: Marland Kitchen MyChart Message (if you have MyChart) OR . A paper copy in the mail If you have any lab test that is abnormal or we need to change your treatment, we will call you to review the results.   Testing/Procedures: Your physician has recommended that you wear a ZIO PATCH holter monitor. Holter monitors are medical devices that record the heart's electrical activity. Doctors most often use these monitors to diagnose arrhythmias. Arrhythmias are problems with the speed or rhythm of the heartbeat. The monitor is a small, portable device. You can wear one while you do your normal daily activities. This is usually used to diagnose what is causing palpitations/syncope (passing out).   Follow-Up: At [facility], you and your health needs are our priority.  As part of our continuing mission to provide you with exceptional heart care, we have created designated Provider Care Teams.  These Care Teams include your primary Cardiologist (physician) and Advanced Practice Providers (APPs -  Physician Assistants and Nurse Practitioners) who all work together to provide you with the care you need, when you need it.  We recommend signing up for the patient portal called "MyChart".  Sign up information is provided on this After Visit Summary.  MyChart is used to connect with patients for Virtual Visits (Telemedicine).  Patients are able to view lab/test results, encounter notes, upcoming appointments, etc.  Non-urgent messages can be sent to your provider as well.   To learn more about what you can do  with MyChart, go to [email or web address].    Your next appointment:   2 month(s)  The format for your next appointment:   In Person  Provider:   Jenne Campus, MD

## 2020-05-30 DIAGNOSIS — I480 Paroxysmal atrial fibrillation: Secondary | ICD-10-CM

## 2020-06-06 ENCOUNTER — Ambulatory Visit (INDEPENDENT_AMBULATORY_CARE_PROVIDER_SITE_OTHER): Payer: Federal, State, Local not specified - PPO

## 2020-06-06 DIAGNOSIS — I480 Paroxysmal atrial fibrillation: Secondary | ICD-10-CM | POA: Diagnosis not present

## 2020-06-20 ENCOUNTER — Telehealth: Payer: Self-pay | Admitting: Cardiology

## 2020-06-20 NOTE — Telephone Encounter (Signed)
° ° °  Received page from [facility] stating that the patient had a 60 second run of AF with RVR which then returned to rate controlled AF. Asked to have strips sent to office for MD review. Message forwarded to primary cardiologist.   Kathyrn Drown NP-C [place] Pager: [phone]

## 2020-06-21 MED ORDER — METOPROLOL SUCCINATE ER 25 MG PO TB24
25.0000 mg | ORAL_TABLET | Freq: Every day | ORAL | 11 refills | Status: DC
Start: 2020-06-21 — End: 2021-05-05

## 2020-06-21 NOTE — Telephone Encounter (Signed)
Returned call to pr.  Left another message for pt to call back.

## 2020-06-21 NOTE — Telephone Encounter (Signed)
Call placed to pt.  Dr. Agustin Cree received strips from [facility].  Per Dr. Agustin Cree, pt needs to reduce Metoprolol to XL 25 mg daily and have pt come into see Dr. Agustin Cree.  1st available is 6/25 in the [facility] office, if pt is asymptomatic. Ok to double book pt.  I have left a message for to call back.

## 2020-06-21 NOTE — Telephone Encounter (Signed)
Returned call to pt.  He has been made aware of the monitor strip.  He stated that he is asymptomatic and felt nothing while he wore the monitor.  Pt actually reports working harder than usual out in the hot sun while he wore the zio patch and felt nothing unusual.    Pt has been scheduled to see Dr. Agustin Cree 07/10/20 @ 10:00.   Pt wanted to report to Dr. Agustin Cree that every since he started the Metoprolol, he has been a different person, and daughter even notes.  Pt looks better and act better.

## 2020-06-21 NOTE — Telephone Encounter (Signed)
Follow up ° °Pt returned call  °

## 2020-06-28 ENCOUNTER — Telehealth: Payer: Self-pay | Admitting: Emergency Medicine

## 2020-06-28 NOTE — Telephone Encounter (Signed)
Left message for patient to return call regarding monitor report.

## 2020-07-05 NOTE — Telephone Encounter (Signed)
Let another message for patient to return call.

## 2020-07-08 NOTE — Telephone Encounter (Signed)
Follow up  Pt is returning call from [facility]

## 2020-07-10 ENCOUNTER — Ambulatory Visit: Payer: Federal, State, Local not specified - PPO | Admitting: Cardiology

## 2020-07-10 ENCOUNTER — Other Ambulatory Visit: Payer: Self-pay

## 2020-07-10 ENCOUNTER — Encounter: Payer: Self-pay | Admitting: Cardiology

## 2020-07-10 VITALS — BP 158/102 | HR 98 | Ht 67.0 in | Wt 178.0 lb

## 2020-07-10 DIAGNOSIS — I6930 Unspecified sequelae of cerebral infarction: Secondary | ICD-10-CM | POA: Diagnosis not present

## 2020-07-10 DIAGNOSIS — I4821 Permanent atrial fibrillation: Secondary | ICD-10-CM | POA: Insufficient documentation

## 2020-07-10 DIAGNOSIS — I10 Essential (primary) hypertension: Secondary | ICD-10-CM | POA: Diagnosis not present

## 2020-07-10 HISTORY — DX: Permanent atrial fibrillation: I48.21

## 2020-07-10 NOTE — Telephone Encounter (Signed)
Patient was seen in office today. Concerns were addressed.

## 2020-07-10 NOTE — Patient Instructions (Signed)

## 2020-07-10 NOTE — Progress Notes (Signed)
Cardiology Office Note:    Date:  07/10/2020   ID:  Cason Luffman, DOB 1941/04/08, MRN [id number]  PCP:  Houston Siren., MD  Cardiologist:  Jenne Campus, MD    Referring MD: Houston Siren., MD   No chief complaint on file. I am doing great  History of Present Illness:    Stephen Russell is a 79 y.o. male past medical history significant for permanent atrial fibrillation, history of CVA requiring thrombectomy, he was without anticoagulation at the time, essential hypertension, comes today to my office to discuss results of his test.  He did wear a monitor which showed tachybradycardia phenomenon.  He will have fast ventricular rate of atrial fibrillation and then pauses to 5 seconds.  He is completely asymptomatic with this he denies having any dizziness passing out syncope.  Overall he is feeling great and kind of surprised that he is here.  He still working very hard have no difficulty doing it.  His daughter is with [place] during the conversation.  Past Medical History:  Diagnosis Date  . Anxiety   . Benign essential hypertension   . Cerebrovascular accident (CVA) due to embolism of cerebral artery ([place])   . CVA (cerebral vascular accident) ([place])   . Edema   . PAD (peripheral artery disease) ([place])   . Paroxysmal A-fib ([place])   . Post herpetic neuralgia   . Pure hypercholesterolemia   . Thrombocytopenia ([place])     Past Surgical History:  Procedure Laterality Date  . THROAT SURGERY      Current Medications: Current Meds  Medication Sig  . ALPRAZolam (XANAX) 0.5 MG tablet Take 0.5 mg by mouth 2 (two) times daily as needed.   Marland Kitchen apixaban (ELIQUIS) 2.5 MG TABS tablet TAKE TWO TABLETS BY MOUTH EVERY MORNING AND TAKE ONE TABLET BY MOUTH EVERY EVENING  . Chlorophyll-Thymol 3-0.6 MG TABS   . Cholecalciferol 25 MCG (1000 UT) tablet Take by mouth.  . co-enzyme Q-10 30 MG capsule   . cyanocobalamin 1000 MCG tablet Take by mouth.  . INOSITOL PO Take by mouth.  . metoprolol  succinate (TOPROL XL) 25 MG 24 hr tablet Take 1 tablet (25 mg total) by mouth daily.  . Multiple Vitamin (MULTI-VITAMIN) tablet Take by mouth.  . Multiple Vitamins-Minerals (ZINC PO) Take by mouth.  . rosuvastatin (CRESTOR) 5 MG tablet Take 1 tablet by mouth  as directed  . spironolactone (ALDACTONE) 25 MG tablet Take 25 mg by mouth daily as needed.  . Zinc 22.5 MG TABS Take 1 tablet by mouth daily.     Allergies:   Citalopram hydrobromide, Hydrochlorothiazide, Oxycodone-acetaminophen, Pravastatin, Quetiapine, and Ivp dye  [iodinated diagnostic agents]   Social History   Socioeconomic History  . Marital status: Divorced    Spouse name: Not on file  . Number of children: Not on file  . Years of education: Not on file  . Highest education level: Not on file  Occupational History  . Not on file  Tobacco Use  . Smoking status: Former Research scientist (life sciences)  . Smokeless tobacco: Never Used  Substance and Sexual Activity  . Alcohol use: Not Currently  . Drug use: Never  . Sexual activity: Not on file  Other Topics Concern  . Not on file  Social History Narrative  . Not on file   Social Determinants of Health   Financial Resource Strain:   . Difficulty of Paying Living Expenses: Not on file  Food Insecurity:   . Worried About [employer] of  Food in the Last Year: Not on file  . Ran Out of Food in the Last Year: Not on file  Transportation Needs:   . Lack of Transportation (Medical): Not on file  . Lack of Transportation (Non-Medical): Not on file  Physical Activity:   . Days of Exercise per Week: Not on file  . Minutes of Exercise per Session: Not on file  Stress:   . Feeling of Stress : Not on file  Social Connections:   . Frequency of Communication with Friends and Family: Not on file  . Frequency of Social Gatherings with Friends and Family: Not on file  . Attends Religious Services: Not on file  . Active Member of Clubs or Organizations: Not on file  . Attends Archivist  Meetings: Not on file  . Marital Status: Not on file     Family History: The patient's family history includes Heart failure in his mother; Stroke in his mother; Tongue cancer in his brother. ROS:   Please see the history of present illness.    All 14 point review of systems negative except as described per history of present illness  EKGs/Labs/Other Studies Reviewed:      Recent Labs: No results found for requested labs within last 8760 hours.  Recent Lipid Panel No results found for: CHOL, TRIG, HDL, CHOLHDL, VLDL, LDLCALC, LDLDIRECT  Physical Exam:    VS:  BP (!) 158/102   Pulse 98   Ht 5\' 7"  (1.702 m)   Wt 178 lb (80.7 kg)   SpO2 100%   BMI 27.88 kg/m     Wt Readings from Last 3 Encounters:  07/10/20 178 lb (80.7 kg)  05/22/20 180 lb (81.6 kg)  03/27/20 186 lb (84.4 kg)     GEN:  Well nourished, well developed in no acute distress HEENT: Normal NECK: No JVD; No carotid bruits LYMPHATICS: No lymphadenopathy CARDIAC: Irregular irregular, no murmurs, no rubs, no gallops RESPIRATORY:  Clear to auscultation without rales, wheezing or rhonchi  ABDOMEN: Soft, non-tender, non-distended MUSCULOSKELETAL:  No edema; No deformity  SKIN: Warm and dry LOWER EXTREMITIES: no swelling NEUROLOGIC:  Alert and oriented x 3 PSYCHIATRIC:  Normal affect   ASSESSMENT:    1. Essential hypertension   2. Permanent atrial fibrillation ([place])   3. Late effect of cerebrovascular accident (CVA)   4. Benign essential hypertension    PLAN:    In order of problems listed above:  1. Tachybradycardia phenomenon.  I did spend a great deal of time explained to what that means.  I offer consideration of pacemaker.  He said he want to think about it.  He is completely asymptomatic after admit.  He is taking right now 50 mg metoprolol he said he feels significantly better with metoprolol rather than atenolol.  He is anticoagulated which I will continue. 2. Essential hypertension: Blood pressure  elevated today but he is excited about what we are doing.  He brought me blood pressure measurements from home his blood pressure is much better at home sufficiently controlled.  We will continue present management. 3. Late effect of CVA doing well from that point review.   Medication Adjustments/Labs and Tests Ordered: Current medicines are reviewed at length with the patient today.  Concerns regarding medicines are outlined above.  No orders of the defined types were placed in this encounter.  Medication changes: No orders of the defined types were placed in this encounter.   Signed, Park Liter, MD, [facility] 07/10/2020 10:37  AM    [place]

## 2020-08-23 ENCOUNTER — Telehealth: Payer: Self-pay | Admitting: Cardiology

## 2020-08-23 DIAGNOSIS — I4821 Permanent atrial fibrillation: Secondary | ICD-10-CM

## 2020-08-23 NOTE — Telephone Encounter (Signed)
Prescription refill request for Eliquis received. Indication: A Fib Last office visit:05/22/20 Scr: 0.97 Age: 79 Weight: 80.7kg  Request received is for Eliquis 2.5mg  two tablets in the morning and 1 tablet in the evening.  Patient's dose should be 5mg  BID.  Routing to MD for input

## 2020-08-23 NOTE — Telephone Encounter (Signed)
*  STAT* If patient is at the pharmacy, call can be transferred to refill team.   1. Which medications need to be refilled? (please list name of each medication and dose if known) apixaban (ELIQUIS) 2.5 MG TABS tablet  2. Which pharmacy/location (including street and city if local pharmacy) is medication to be sent to? [facility] [place], [place]  3. Do they need a 30 day or 90 day supply? 90 day

## 2020-08-24 NOTE — Telephone Encounter (Signed)
No, please switch to proper dose which is 5 mg twice daily, thank you

## 2020-08-26 MED ORDER — APIXABAN 5 MG PO TABS: 5.0000 mg | ORAL_TABLET | Freq: Two times a day (BID) | ORAL | 1 refills | Status: DC

## 2020-08-26 NOTE — Telephone Encounter (Signed)
Changing dose to Eliquis 5mg  BID.  Called patient and left message on machine.

## 2020-11-29 ENCOUNTER — Ambulatory Visit: Payer: Federal, State, Local not specified - PPO | Admitting: Cardiology

## 2021-01-17 ENCOUNTER — Other Ambulatory Visit: Payer: Self-pay

## 2021-01-17 DIAGNOSIS — B0229 Other postherpetic nervous system involvement: Secondary | ICD-10-CM | POA: Insufficient documentation

## 2021-01-23 ENCOUNTER — Ambulatory Visit (INDEPENDENT_AMBULATORY_CARE_PROVIDER_SITE_OTHER): Payer: Federal, State, Local not specified - PPO

## 2021-01-23 ENCOUNTER — Other Ambulatory Visit: Payer: Self-pay

## 2021-01-23 ENCOUNTER — Ambulatory Visit: Payer: Federal, State, Local not specified - PPO | Admitting: Cardiology

## 2021-01-23 ENCOUNTER — Encounter: Payer: Self-pay | Admitting: Cardiology

## 2021-01-23 VITALS — BP 130/80 | HR 53 | Ht 67.0 in | Wt 177.0 lb

## 2021-01-23 DIAGNOSIS — I10 Essential (primary) hypertension: Secondary | ICD-10-CM | POA: Diagnosis not present

## 2021-01-23 DIAGNOSIS — E7800 Pure hypercholesterolemia, unspecified: Secondary | ICD-10-CM

## 2021-01-23 DIAGNOSIS — I6930 Unspecified sequelae of cerebral infarction: Secondary | ICD-10-CM

## 2021-01-23 DIAGNOSIS — I4821 Permanent atrial fibrillation: Secondary | ICD-10-CM | POA: Diagnosis not present

## 2021-01-23 MED ORDER — APIXABAN 5 MG PO TABS: 5.0000 mg | ORAL_TABLET | Freq: Two times a day (BID) | ORAL | 1 refills | Status: DC

## 2021-01-23 NOTE — Patient Instructions (Signed)
Medication Instructions:  Your physician has recommended you make the following change in your medication:   Make sure you are taking eliquis 5 mg twice daily   *If you need a refill on your cardiac medications before your next appointment, please call your pharmacy*   Lab Work: None If you have labs (blood work) drawn today and your tests are completely normal, you will receive your results only by: Marland Kitchen MyChart Message (if you have MyChart) OR . A paper copy in the mail If you have any lab test that is abnormal or we need to change your treatment, we will call you to review the results.   Testing/Procedures: Your physician has requested that you have an echocardiogram. Echocardiography is a painless test that uses sound waves to create images of your heart. It provides your doctor with information about the size and shape of your heart and how well your heart's chambers and valves are working. This procedure takes approximately one hour. There are no restrictions for this procedure.  A zio monitor was ordered today. It will remain on for 7 days. You will then return monitor and event diary in provided box. It takes 1-2 weeks for report to be downloaded and returned to [place]. We will call you with the results. If monitor falls off or has orange flashing light, please call Zio for further instructions.      Follow-Up: At [facility], you and your health needs are our priority.  As part of our continuing mission to provide you with exceptional heart care, we have created designated Provider Care Teams.  These Care Teams include your primary Cardiologist (physician) and Advanced Practice Providers (APPs -  Physician Assistants and Nurse Practitioners) who all work together to provide you with the care you need, when you need it.  We recommend signing up for the patient portal called "MyChart".  Sign up information is provided on this After Visit Summary.  MyChart is used to connect with  patients for Virtual Visits (Telemedicine).  Patients are able to view lab/test results, encounter notes, upcoming appointments, etc.  Non-urgent messages can be sent to your provider as well.   To learn more about what you can do with MyChart, go to [email or web address].    Your next appointment:   3 month(s)  The format for your next appointment:   In Person  Provider:   Jenne Campus, MD   Other Instructions   Echocardiogram An echocardiogram is a test that uses sound waves (ultrasound) to produce images of the heart. Images from an echocardiogram can provide important information about:  Heart size and shape.  The size and thickness and movement of your heart's walls.  Heart muscle function and strength.  Heart valve function or if you have stenosis. Stenosis is when the heart valves are too narrow.  If blood is flowing backward through the heart valves (regurgitation).  A tumor or infectious growth around the heart valves.  Areas of heart muscle that are not working well because of poor blood flow or injury from a heart attack.  Aneurysm detection. An aneurysm is a weak or damaged part of an artery wall. The wall bulges out from the normal force of blood pumping through the body. Tell a health care provider about:  Any allergies you have.  All medicines you are taking, including vitamins, herbs, eye drops, creams, and over-the-counter medicines.  Any blood disorders you have.  Any surgeries you have had.  Any medical conditions you have.  Whether you are pregnant or may be pregnant. What are the risks? Generally, this is a safe test. However, problems may occur, including an allergic reaction to dye (contrast) that may be used during the test. What happens before the test? No specific preparation is needed. You may eat and drink normally. What happens during the test?  You will take off your clothes from the waist up and put on a hospital  gown.  Electrodes or electrocardiogram (ECG)patches may be placed on your chest. The electrodes or patches are then connected to a device that monitors your heart rate and rhythm.  You will lie down on a table for an ultrasound exam. A gel will be applied to your chest to help sound waves pass through your skin.  A handheld device, called a transducer, will be pressed against your chest and moved over your heart. The transducer produces sound waves that travel to your heart and bounce back (or "echo" back) to the transducer. These sound waves will be captured in real-time and changed into images of your heart that can be viewed on a video monitor. The images will be recorded on a computer and reviewed by your health care provider.  You may be asked to change positions or hold your breath for a short time. This makes it easier to get different views or better views of your heart.  In some cases, you may receive contrast through an IV in one of your veins. This can improve the quality of the pictures from your heart. The procedure may vary among health care providers and hospitals.   What can I expect after the test? You may return to your normal, everyday life, including diet, activities, and medicines, unless your health care provider tells you not to do that. Follow these instructions at home:  It is up to you to get the results of your test. Ask your health care provider, or the department that is doing the test, when your results will be ready.  Keep all follow-up visits. This is important. Summary  An echocardiogram is a test that uses sound waves (ultrasound) to produce images of the heart.  Images from an echocardiogram can provide important information about the size and shape of your heart, heart muscle function, heart valve function, and other possible heart problems.  You do not need to do anything to prepare before this test. You may eat and drink normally.  After the  echocardiogram is completed, you may return to your normal, everyday life, unless your health care provider tells you not to do that. This information is not intended to replace advice given to you by your health care provider. Make sure you discuss any questions you have with your health care provider. Document Revised: 06/25/2020 Document Reviewed: 06/25/2020 Elsevier Patient Education  2021 [employer].

## 2021-01-23 NOTE — Progress Notes (Signed)
Cardiology Office Note:    Date:  01/23/2021   ID:  Stephen Russell, DOB January 07, 1941, MRN [id number]  PCP:  Houston Siren., MD  Cardiologist:  Jenne Campus, MD    Referring MD: Houston Siren., MD   Chief Complaint  Patient presents with  . Follow-up  I am doing fine  History of Present Illness:    Stephen Russell is a 80 y.o. 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  Another issue that he had was long pauses on the monitor while he was taking atenolol.  Overall he is doing well.  Denies have any chest pain tightness squeezing pressure burning chest no swelling of lower extremities no dizziness no passing out.  He does not have any nightmares.  Does feel things that he discovered during the visit for several he is taking Eliquis is very strange way he takes 2 tablets of 2.5 mg in the morning two-point 5 in the afternoon and then 2.5 at evening time he cannot explain to me why he is taking this that way but clearly we need to simplify his that.  Another issue is the fact that his metoprolol has been increased 150 mg daily and I am worried about him having significant pauses, luckily it looks like he is asymptomatic.  He is trying to be active trying to work and do things and managing quite well.  Past Medical History:  Diagnosis Date  . Allergic rhinitis 03/27/2020  . Anxiety   . Benign essential hypertension   . Cerebrovascular accident (CVA) due to embolism of cerebral artery ([place])   . Combined forms of age-related cataract of left eye 01/18/2020  . Contusion of wrist 03/12/2011   Qualifier: Diagnosis of  By: Alveta Heimlich MD, Cornelia Copa    . CVA (cerebral vascular accident) ([place])   . Degenerative disc disease, lumbar 04/09/2017  . Edema   . Elevated PSA 06/15/2019  . Essential hypertension 03/12/2011   Qualifier: Diagnosis of  By: Alveta Heimlich MD, Cornelia Copa    .  Hyponatremia 04/01/2016  . Insomnia 03/27/2020  . 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  . 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 Muscle spasm 03/27/2020  . PAD (peripheral artery disease) ([place])   . Palpitations 02/19/2012  . Paroxysmal A-fib ([place])   . Permanent atrial fibrillation ([place]) 07/10/2020  . Post herpetic neuralgia   . Pure hypercholesterolemia   . PVC's (premature ventricular contractions) 02/19/2012  . Thrombocytopenia ([place])   . 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 THROAT SURGERY      Current Medications: Current Meds  Medication Sig  . ALPRAZolam (XANAX) 0.5 MG tablet Take 0.5 mg by mouth 2 (two) times daily as needed for anxiety or sleep.  Marland Kitchen apixaban (ELIQUIS) 5 MG TABS tablet Take 1 tablet (5 mg total) by mouth 2 (two) times daily.  . Azelastine HCl 0.15 % SOLN Place 2 sprays into the nose daily.  . Chlorophyll-Thymol 3-0.6 MG TABS   . Cholecalciferol 25 MCG (1000 UT) tablet Take by mouth.  . co-enzyme Q-10 30 MG capsule   . cyanocobalamin 1000 MCG tablet Take by mouth.  . fluticasone (FLONASE) 50 MCG/ACT nasal spray Place 2 sprays into both nostrils daily.  . INOSITOL PO Take by mouth.  . metoprolol succinate (TOPROL XL) 25 MG 24 hr tablet Take 1 tablet (25 mg total)  by mouth daily. (Patient taking differently: Take 150 mg by mouth daily.)  . mometasone (ELOCON) 0.1 % cream Apply 1 application topically daily.  . Multiple Vitamin (MULTI-VITAMIN) tablet Take by mouth.  . Multiple Vitamins-Minerals (ZINC PO) Take by mouth.  . rosuvastatin (CRESTOR) 5 MG tablet Take 1 tablet by mouth  as directed  . spironolactone (ALDACTONE) 25 MG tablet Take 25 mg by mouth daily as needed (fluids).     Allergies:   Citalopram hydrobromide, Hydrochlorothiazide, Oxycodone-acetaminophen, Pravastatin, Quetiapine, and Ivp dye  [iodinated diagnostic agents]   Social History   Socioeconomic History  . Marital  status: Divorced    Spouse name: Not on file  . Number of children: Not on file  . Years of education: Not on file  . Highest education level: Not on file  Occupational History  . Not on file  Tobacco Use  . Smoking status: Former Research scientist (life sciences)  . Smokeless tobacco: Never Used  Substance and Sexual Activity  . Alcohol use: Not Currently  . Drug use: Never  . Sexual activity: Not on file  Other Topics Concern  . Not on file  Social History Narrative  . Not on file   Social Determinants of Health   Financial Resource Strain: Not on file  Food Insecurity: Not on file  Transportation Needs: Not on file  Physical Activity: Not on file  Stress: Not on file  Social Connections: Not on file     Family History: The patient's family history includes Heart failure in his mother; Stroke in his mother; Tongue cancer in his brother. ROS:   Please see the history of present illness.    All 14 point review of systems negative except as described per history of present illness  EKGs/Labs/Other Studies Reviewed:      Recent Labs: No results found for requested labs within last 8760 hours.  Recent Lipid Panel No results found for: CHOL, TRIG, HDL, CHOLHDL, VLDL, LDLCALC, LDLDIRECT  Physical Exam:    VS:  BP 130/80 (BP Location: Right Arm, Patient Position: Sitting)   Pulse (!) 53   Ht 5\' 7"  (1.702 m)   Wt 177 lb (80.3 kg)   SpO2 98%   BMI 27.72 kg/m     Wt Readings from Last 3 Encounters:  01/23/21 177 lb (80.3 kg)  07/10/20 178 lb (80.7 kg)  05/22/20 180 lb (81.6 kg)     GEN:  Well nourished, well developed in no acute distress HEENT: Normal NECK: No JVD; No carotid bruits LYMPHATICS: No lymphadenopathy CARDIAC: Irregularly irregular, no murmurs, no rubs, no gallops RESPIRATORY:  Clear to auscultation without rales, wheezing or rhonchi  ABDOMEN: Soft, non-tender, non-distended MUSCULOSKELETAL:  No edema; No deformity  SKIN: Warm and dry LOWER EXTREMITIES: no  swelling NEUROLOGIC:  Alert and oriented x 3 PSYCHIATRIC:  Normal affect   ASSESSMENT:    1. Permanent atrial fibrillation ([place])   2. Late effect of cerebrovascular accident (CVA)   3. Pure hypercholesterolemia   4. Benign essential hypertension    PLAN:    In order of problems listed above:  1. Permanent atrial fibrillation he is anticoagulated but will simplify his management.  He need to be taken 5 mg of Eliquis twice daily.  I will ask him to wear Zio patch to see if he gets significant bradycardia during the night.  So far I do not see indications for pacemaker but this is something that will be reconsidered after monitor will be placed.  As a part of  evaluation I will ask him also to have an echocardiogram to assess left ventricle ejection fraction more importantly look at the size of atrium. 2. Late effect of CVA he seems to doing very well from that point review.  Decreased anticoagulation, chads 2 vascular equals 5. 3. Dyslipidemia, he is taking low-dose of Crestor which I will continue for now will call primary care physician to get his fasting lipid profile. 4. Benign essential hypertension blood pressure seems to be well controlled continue present management.   Medication Adjustments/Labs and Tests Ordered: Current medicines are reviewed at length with the patient today.  Concerns regarding medicines are outlined above.  No orders of the defined types were placed in this encounter.  Medication changes: No orders of the defined types were placed in this encounter.   Signed, Park Liter, MD, [facility] 01/23/2021 9:32 AM    [place]

## 2021-01-23 NOTE — Addendum Note (Signed)
Addended by: Senaida Ores on: 01/23/2021 09:42 AM   Modules accepted: Orders

## 2021-02-07 ENCOUNTER — Telehealth: Payer: Self-pay

## 2021-02-07 NOTE — Telephone Encounter (Signed)
-----   Message from Park Liter, MD sent at 02/06/2021 11:40 AM EDT ----- Atrial fibrillation noted on the monitor ultra recording with relatively controlled ventricular rate.  Continue present management

## 2021-02-07 NOTE — Telephone Encounter (Signed)
Patient notified of results and verbalized understanding.  

## 2021-03-03 ENCOUNTER — Other Ambulatory Visit: Payer: Self-pay

## 2021-03-03 ENCOUNTER — Ambulatory Visit (HOSPITAL_BASED_OUTPATIENT_CLINIC_OR_DEPARTMENT_OTHER)
Admission: RE | Admit: 2021-03-03 | Discharge: 2021-03-03 | Disposition: A | Discharge status: Home / Self Care | Payer: Federal, State, Local not specified - PPO | Source: Ambulatory Visit | Attending: Cardiology | Admitting: Cardiology

## 2021-03-03 DIAGNOSIS — I10 Essential (primary) hypertension: Secondary | ICD-10-CM | POA: Insufficient documentation

## 2021-03-03 DIAGNOSIS — E7800 Pure hypercholesterolemia, unspecified: Secondary | ICD-10-CM | POA: Insufficient documentation

## 2021-03-03 DIAGNOSIS — I6930 Unspecified sequelae of cerebral infarction: Secondary | ICD-10-CM | POA: Diagnosis present

## 2021-03-03 DIAGNOSIS — I4821 Permanent atrial fibrillation: Secondary | ICD-10-CM | POA: Diagnosis not present

## 2021-03-03 LAB — ECHOCARDIOGRAM COMPLETE
AR max vel: 2.6 cm2
AV Area VTI: 2.58 cm2
AV Area mean vel: 2.32 cm2
AV Mean grad: 2 mmHg
AV Peak grad: 4.4 mmHg
Ao pk vel: 1.05 m/s
Area-P 1/2: 3.99 cm2
MV VTI: 1.87 cm2
P 1/2 time: 505 msec
S' Lateral: 2.67 cm

## 2021-03-04 ENCOUNTER — Telehealth: Payer: Self-pay | Admitting: Cardiology

## 2021-03-04 NOTE — Telephone Encounter (Signed)
VM left for pt to call back

## 2021-03-04 NOTE — Telephone Encounter (Signed)
° °  Pt is returning call to get echo result °

## 2021-03-04 NOTE — Telephone Encounter (Signed)
Results reviewed with pt as per Dr. Krasowski's note.  Pt verbalized understanding and had no additional questions. Routed to PCP  

## 2021-04-21 ENCOUNTER — Other Ambulatory Visit: Payer: Self-pay

## 2021-05-05 ENCOUNTER — Encounter: Payer: Self-pay | Admitting: Cardiology

## 2021-05-05 ENCOUNTER — Other Ambulatory Visit: Payer: Self-pay

## 2021-05-05 ENCOUNTER — Ambulatory Visit (INDEPENDENT_AMBULATORY_CARE_PROVIDER_SITE_OTHER): Payer: Federal, State, Local not specified - PPO | Admitting: Cardiology

## 2021-05-05 VITALS — BP 185/116 | HR 80 | Ht 67.0 in | Wt 172.0 lb

## 2021-05-05 DIAGNOSIS — I6930 Unspecified sequelae of cerebral infarction: Secondary | ICD-10-CM

## 2021-05-05 DIAGNOSIS — I4821 Permanent atrial fibrillation: Secondary | ICD-10-CM

## 2021-05-05 DIAGNOSIS — E7800 Pure hypercholesterolemia, unspecified: Secondary | ICD-10-CM

## 2021-05-05 DIAGNOSIS — I10 Essential (primary) hypertension: Secondary | ICD-10-CM

## 2021-05-05 NOTE — Patient Instructions (Signed)

## 2021-05-05 NOTE — Progress Notes (Addendum)
Cardiology Office Note:    Date:  05/05/2021   ID:  Stephen Russell, DOB 10/15/1941, MRN [id number]  PCP:  Houston Siren., MD  Cardiologist:  Jenne Campus, MD    Referring MD: Houston Siren., MD   Chief Complaint  Patient presents with   Follow-up  I am doing well  History of Present Illness:    Stephen Russell is a 80 y.o. male with past medical history significant for permanent atrial fibrillation, history of CVA did require thrombectomy, essential hypertension.  Recently we have been investigating tachybradycardia phenomenon.  We changed his medication somewhat and then he wore a monitor which did not show any significant bradycardia his atrial fibrillation appears to be controlled.  He tells me that he is doing great.  He denies having any dizziness or passing out there is no nightmares.  He is very busy he does have a lot of things that he have to do.  Denies have any chest pain tightness squeezing pressure burning chest no swelling of lower extremities  Past Medical History:  Diagnosis Date   Allergic rhinitis 03/27/2020   Anxiety    Benign essential hypertension    Cerebrovascular accident (CVA) due to embolism of cerebral artery ([place])    Combined forms of age-related cataract of left eye 01/18/2020   Contusion of wrist 03/12/2011   Qualifier: Diagnosis of  By: Alveta Heimlich MD, Cornelia Copa     CVA (cerebral vascular accident) [facility])    Degenerative disc disease, lumbar 04/09/2017   Edema    Elevated PSA 06/15/2019   Essential hypertension 03/12/2011   Qualifier: Diagnosis of  By: Alveta Heimlich MD, [place]     Hyponatremia 04/01/2016   Insomnia 03/27/2020   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   apixaban (ELIQUIS) 5 MG TABS tablet Take 1 tablet (5 mg total) by mouth 2 (two) times daily.   Azelastine HCl 0.15 % SOLN Place 2 sprays into the nose daily.   Chlorophyll-Thymol 3-0.6 MG TABS Take 1 tablet by mouth daily.   Cholecalciferol 25 MCG (1000 UT) tablet Take 1,000 Units by mouth daily.   co-enzyme Q-10 30 MG capsule Take 30 mg by mouth daily.   cyanocobalamin 1000 MCG tablet Take 1,000 mcg by mouth daily.   fluticasone (FLONASE) 50 MCG/ACT nasal spray Place 2 sprays into both nostrils daily.   gabapentin (NEURONTIN) 300 MG capsule Take 1 capsule by mouth at bedtime.   INOSITOL PO Take 1 tablet by mouth daily.   irbesartan (AVAPRO) 300 MG tablet Take 0.5 tablets by mouth daily.   lisinopril (ZESTRIL) 5 MG tablet Take 1 tablet by mouth daily.   losartan (COZAAR) 25 MG tablet Take 25 mg by mouth daily.   metoprolol succinate (TOPROL-XL) 50 MG 24 hr tablet Take 50 mg by mouth 3 (three) times daily as needed.  mometasone (ELOCON) 0.1 % cream Apply 1 application topically daily.   Multiple Vitamin (MULTI-VITAMIN) tablet Take 1 tablet by mouth daily.   rosuvastatin (CRESTOR) 5 MG tablet Take 5 mg by mouth daily.   spironolactone (ALDACTONE) 25 MG tablet Take 25 mg by mouth daily as needed (fluids).   Zinc 25 MG TABS Take 12.5 mg by mouth daily.     Allergies:   Citalopram hydrobromide, Hydrochlorothiazide, Oxycodone-acetaminophen, Pravastatin, Quetiapine, and Ivp dye  [iodinated diagnostic agents]   Social History   Socioeconomic History   Marital status: Divorced    Spouse name: Not on file   Number of children: Not on file   Years of  education: Not on file   Highest education level: Not on file  Occupational History   Not on file  Tobacco Use   Smoking status: Former    Pack years: 0.00   Smokeless tobacco: Never  Substance and Sexual Activity   Alcohol use: Not Currently   Drug use: Never   Sexual activity: Not on file  Other Topics Concern   Not on file  Social History Narrative   Not on file   Social Determinants of Health   Financial Resource Strain: Not on file  Food Insecurity: Not on file  Transportation Needs: Not on file  Physical Activity: Not on file  Stress: Not on file  Social Connections: Not on file     Family History: The patient's family history includes Heart failure in his mother; Stroke in his mother; Tongue cancer in his brother. ROS:   Please see the history of present illness.    All 14 point review of systems negative except as described per history of present illness  EKGs/Labs/Other Studies Reviewed:      Recent Labs: No results found for requested labs within last 8760 hours.  Recent Lipid Panel No results found for: CHOL, TRIG, HDL, CHOLHDL, VLDL, LDLCALC, LDLDIRECT  Physical Exam:    VS:  BP (!) 185/116 (BP Location: Left Arm, Patient Position: Sitting, Cuff Size: Normal)   Pulse 80   Ht 5\' 7"  (1.702 m)   Wt 172 lb (78 kg)   SpO2 92%   BMI 26.94 kg/m     Wt Readings from Last 3 Encounters:  05/05/21 172 lb (78 kg)  01/23/21 177 lb (80.3 kg)  07/10/20 178 lb (80.7 kg)     GEN:  Well nourished, well developed in no acute distress HEENT: Normal NECK: No JVD; No carotid bruits LYMPHATICS: No lymphadenopathy CARDIAC: Irregular irregular, no murmurs, no rubs, no gallops RESPIRATORY:  Clear to auscultation without rales, wheezing or rhonchi  ABDOMEN: Soft, non-tender, non-distended MUSCULOSKELETAL:  No edema; No deformity  SKIN: Warm and dry LOWER EXTREMITIES: no swelling NEUROLOGIC:  Alert and oriented x 3 PSYCHIATRIC:  Normal affect   ASSESSMENT:     1. Permanent atrial fibrillation ([place])   2. Essential hypertension   3. Late effect of cerebrovascular accident (CVA)   4. Pure hypercholesterolemia    PLAN:    In order of problems listed above:  Permanent atrial fibrillation.  He is anticoagulated and he takes his medication the proper way.  He is doing very well asymptomatic. Bradycardia denies having dizziness passing out no nightmares.  Last monitor did not show critical bradycardia. Late effect of CVA stable from that point review Dyslipidemia I did review blood work done by primary care physician showing LDL 95 HDL 44 this is a statin however he does not want to increase the  dose of medication he is actually asking if he can discontinue that.  I told him absolutely not that he need to continue with Crestor Overall he is doing very well.  I did review echocardiogram with him I see him back in 6 months Essential hypertension his blood pressure is very elevated today but he said he always gets very excited when he comes to doctor's office on top of that he said he could not sleep more than 2 hours last night with anticipation of this visit.  He also tells me when he checked his blood pressure at home it is usually 130/70   Medication Adjustments/Labs and Tests Ordered: Current medicines are reviewed at length with the patient today.  Concerns regarding medicines are outlined above.  No orders of the defined types were placed in this encounter.  Medication changes: No orders of the defined types were placed in this encounter.   Signed, Park Liter, MD, [facility] 05/05/2021 9:39 AM    [place]

## 2021-11-04 ENCOUNTER — Other Ambulatory Visit: Payer: Self-pay

## 2021-11-04 ENCOUNTER — Ambulatory Visit (INDEPENDENT_AMBULATORY_CARE_PROVIDER_SITE_OTHER): Payer: Federal, State, Local not specified - PPO | Admitting: Cardiology

## 2021-11-04 ENCOUNTER — Encounter: Payer: Self-pay | Admitting: Cardiology

## 2021-11-04 VITALS — BP 138/72 | HR 76 | Ht 67.0 in | Wt 175.0 lb

## 2021-11-04 DIAGNOSIS — I10 Essential (primary) hypertension: Secondary | ICD-10-CM | POA: Diagnosis not present

## 2021-11-04 DIAGNOSIS — I4821 Permanent atrial fibrillation: Secondary | ICD-10-CM

## 2021-11-04 DIAGNOSIS — C329 Malignant neoplasm of larynx, unspecified: Secondary | ICD-10-CM

## 2021-11-04 DIAGNOSIS — R002 Palpitations: Secondary | ICD-10-CM | POA: Diagnosis not present

## 2021-11-04 NOTE — Progress Notes (Signed)
Cardiology Office Note:    Date:  11/04/2021   ID:  Stephen Russell, DOB [birth year], MRN [id number]  PCP:  Houston Siren., MD  Cardiologist:  Jenne Campus, MD    Referring MD: Houston Siren., MD   No chief complaint on file. I am doing great  History of Present Illness:    Stephen Russell is a 80 y.o. male with past medical history significant for permanent atrial fibrillation, and essential hypertension, dyslipidemia.  He comes today to my office for follow-up.  Overall he is doing great.  He is asymptomatic he exercised on the regular basis have no difficulty doing it.  He did wear a Zio patch which showed persistent atrial fibrillation with some high variation of heart rate but he is completely asymptomatic on top of that his echocardiogram showed preserved left ventricle ejection fraction.  Last time increase dose of beta-blocker since that time she seems to be doing better.  Past Medical History:  Diagnosis Date   Allergic rhinitis 03/27/2020   Anxiety    Benign essential hypertension    Cerebrovascular accident (CVA) due to embolism of cerebral artery ([place])    Combined forms of age-related cataract of left eye 01/18/2020   Contusion of wrist 03/12/2011   Qualifier: Diagnosis of  By: Alveta Heimlich MD, Cornelia Copa     CVA (cerebral vascular accident) [facility])    Degenerative disc disease, lumbar 04/09/2017   Edema    Elevated PSA 06/15/2019   Essential hypertension 03/12/2011   Qualifier: Diagnosis of  By: Alveta Heimlich MD, [place]     Hyponatremia 04/01/2016   Insomnia 03/27/2020   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   apixaban (ELIQUIS) 5 MG TABS tablet Take 1 tablet (5 mg total) by mouth 2 (two) times daily.   Azelastine HCl 0.15 % SOLN Place 2 sprays into the nose daily.   Chlorophyll-Thymol 3-0.6 MG TABS Take 1 tablet by mouth daily.   Cholecalciferol 25 MCG (1000 UT) tablet Take 1,000 Units by mouth daily.   co-enzyme Q-10 30 MG capsule Take 300 mg by mouth daily.   cyanocobalamin 1000 MCG tablet Take 1,000 mcg by mouth daily.   fluticasone (FLONASE) 50 MCG/ACT nasal spray Place 2 sprays into both nostrils daily.   gabapentin (NEURONTIN) 300 MG capsule Take 1 capsule by mouth at bedtime.   INOSITOL PO Take 1 tablet by mouth daily.   losartan (COZAAR) 25 MG tablet Take 25 mg by mouth daily.   metoprolol succinate (TOPROL-XL) 50 MG 24 hr tablet Take 50 mg by mouth 3 (three) times daily as needed.   mometasone (ELOCON) 0.1 % cream Apply 1 application topically daily.   Multiple Vitamin (MULTI-VITAMIN) tablet Take 1 tablet by mouth daily.   rosuvastatin (CRESTOR) 5 MG tablet Take 5 mg by mouth daily.   spironolactone (ALDACTONE) 25 MG  tablet Take 25 mg by mouth daily as needed (fluids).   Zinc 25 MG TABS Take 12.5 mg by mouth daily.     Allergies:   Citalopram hydrobromide, Hydrochlorothiazide, Oxycodone-acetaminophen, Pravastatin, Quetiapine, and Ivp dye  [iodinated diagnostic agents]   Social History   Socioeconomic History   Marital status: Divorced    Spouse name: Not on file   Number of children: Not on file   Years of education: Not on file   Highest education level: Not on file  Occupational History   Not on file  Tobacco Use   Smoking status: Former   Smokeless tobacco: Never  Substance and Sexual Activity   Alcohol use: Not  Currently   Drug use: Never   Sexual activity: Not on file  Other Topics Concern   Not on file  Social History Narrative   Not on file   Social Determinants of Health   Financial Resource Strain: Not on file  Food Insecurity: Not on file  Transportation Needs: Not on file  Physical Activity: Not on file  Stress: Not on file  Social Connections: Not on file     Family History: The patient's family history includes Heart failure in his mother; Stroke in his mother; Tongue cancer in his brother. ROS:   Please see the history of present illness.    All 14 point review of systems negative except as described per history of present illness  EKGs/Labs/Other Studies Reviewed:      Recent Labs: No results found for requested labs within last 8760 hours.  Recent Lipid Panel No results found for: CHOL, TRIG, HDL, CHOLHDL, VLDL, LDLCALC, LDLDIRECT  Physical Exam:    VS:  BP 138/72 (BP Location: Left Arm, Patient Position: Sitting)    Pulse 76    Ht 5\' 7"  (1.702 m)    Wt 175 lb (79.4 kg)    SpO2 92%    BMI 27.41 kg/m     Wt Readings from Last 3 Encounters:  11/04/21 175 lb (79.4 kg)  05/05/21 172 lb (78 kg)  01/23/21 177 lb (80.3 kg)     GEN:  Well nourished, well developed in no acute distress HEENT: Normal NECK: No JVD; No carotid bruits LYMPHATICS: No lymphadenopathy CARDIAC: Irregularly irregular, no murmurs, no rubs, no gallops RESPIRATORY:  Clear to auscultation without rales, wheezing or rhonchi  ABDOMEN: Soft, non-tender, non-distended MUSCULOSKELETAL:  No edema; No deformity  SKIN: Warm and dry LOWER EXTREMITIES: no swelling NEUROLOGIC:  Alert and oriented x 3 PSYCHIATRIC:  Normal affect   ASSESSMENT:    1. Permanent atrial fibrillation ([place])   2. Essential hypertension   3. Malignant neoplasm of larynx (HCC)   4. Palpitations    PLAN:    In order of problems listed above:  Permanent atrial fibrillation rate is controlled we will continue present  medications.  He is asymptomatic. Essential hypertension blood pressure uncontrolled.  We will encourage him to start taking losartan the regular basis look like so far he was taking this only on as-needed basis. Dyslipidemia he is being followed by primary care physician he is on Crestor 5 which I will continue.   Medication Adjustments/Labs and Tests Ordered: Current medicines are reviewed at length with the patient today.  Concerns regarding medicines are outlined above.  No orders of the defined types were placed in this encounter.  Medication changes: No orders of the defined types were placed in this encounter.   Signed, Park Liter, MD, [facility] 11/04/2021 9:18 AM  [facility]

## 2021-11-04 NOTE — Patient Instructions (Signed)

## 2021-11-11 ENCOUNTER — Telehealth: Payer: Self-pay

## 2021-11-11 DIAGNOSIS — I4821 Permanent atrial fibrillation: Secondary | ICD-10-CM

## 2021-11-11 MED ORDER — APIXABAN 5 MG PO TABS: 5.0000 mg | ORAL_TABLET | Freq: Two times a day (BID) | ORAL | 1 refills | Status: DC

## 2021-11-11 NOTE — Telephone Encounter (Signed)
Prescription refill request for Eliquis received. Indication: Afib  Last office visit:11/04/21 Jeryl [place])  Scr: 0.89 (06/24/21)  Age: 80 Weight: 79.4kg  Appropriate dose and refill sent to requested pharmacy.

## 2022-03-01 ENCOUNTER — Other Ambulatory Visit: Payer: Self-pay | Admitting: Cardiology

## 2022-03-01 DIAGNOSIS — I4821 Permanent atrial fibrillation: Secondary | ICD-10-CM

## 2022-03-02 NOTE — Telephone Encounter (Signed)
Prescription refill request for Eliquis received. ?Indication:Afib ?Last office visit:12/22 ?Scr:0.8 ?Age: 81 ?Weight:79.4 kg ? ?Prescription refilled ? ?

## 2022-05-10 ENCOUNTER — Other Ambulatory Visit: Payer: Self-pay | Admitting: Cardiology

## 2022-05-10 DIAGNOSIS — I4821 Permanent atrial fibrillation: Secondary | ICD-10-CM

## 2022-11-06 ENCOUNTER — Other Ambulatory Visit: Payer: Self-pay | Admitting: Cardiology

## 2022-11-06 DIAGNOSIS — I4821 Permanent atrial fibrillation: Secondary | ICD-10-CM

## 2022-11-06 NOTE — Telephone Encounter (Signed)
Prescription refill request for Eliquis received. Indication:afib Last office visit:11/23 Scr:0.9 Age: 81 Weight:79.4  kg  Prescription refilled

## 2023-02-05 ENCOUNTER — Other Ambulatory Visit: Payer: Self-pay | Admitting: Cardiology

## 2023-02-05 DIAGNOSIS — I4821 Permanent atrial fibrillation: Secondary | ICD-10-CM

## 2023-02-08 NOTE — Telephone Encounter (Signed)
Refill request for Eliquis. Pt is now being seen by Dr Letta Median  [facility] Refill refused

## 2023-04-22 ENCOUNTER — Other Ambulatory Visit: Payer: Self-pay | Admitting: Cardiology

## 2023-04-22 DIAGNOSIS — I4821 Permanent atrial fibrillation: Secondary | ICD-10-CM

## 2023-04-23 NOTE — Telephone Encounter (Addendum)
Eliquis 5mg  refill request received. Patient is 82 years old, weight-79.4kg, Crea-0.95 on 04/23/23 via [facility], Diagnosis-Afib, and last seen by Dr. Bing Matter on 11/04/21-Needs Appt. Dose is appropriate based on dosing criteria.   Per Care Everywhere pt is being seen at [facility], will need to clarify with pt to see if he is planning to continue with [facility] or with [facility].   Called pt to inquire about the above; had to leave a voicemail to call back to clarify if he has another Cardiologist and if not he needs to schedule with Dr. Dr. Bing Matter.    Pt followed by Cardiology at [facility]

## 2023-07-21 ENCOUNTER — Other Ambulatory Visit: Payer: Self-pay | Admitting: Cardiology

## 2023-07-21 DIAGNOSIS — I4821 Permanent atrial fibrillation: Secondary | ICD-10-CM

## 2024-05-23 ENCOUNTER — Ambulatory Visit: Admitting: Sports Medicine

## 2024-05-23 ENCOUNTER — Encounter: Payer: Self-pay | Admitting: Sports Medicine

## 2024-05-23 DIAGNOSIS — S86111A Strain of other muscle(s) and tendon(s) of posterior muscle group at lower leg level, right leg, initial encounter: Secondary | ICD-10-CM | POA: Diagnosis not present

## 2024-05-23 NOTE — Assessment & Plan Note (Signed)
 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 Today he really has no pain, he has a bit of swelling in the right calf with a negative Homans' sign, negative Thompson's test, good motion, good strength, able to stand, as well as hop up and down on the affected extremity with assistance with balance. I think he did have a gastrocnemius strain musculotendinous junction that is now healed, he will do some calf conditioning, I have advised elevation of the leg, no DVT ultrasound needed. I have advised that he ambulate with an assistive device considering his imbalance, I have also suggested vestibular and balance physical therapy, family will think about it. Return to see me as needed.

## 2024-05-23 NOTE — Progress Notes (Signed)
    Procedures performed today:    None.  Independent interpretation of notes and tests performed by another provider:   None.  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 Today he really has no pain, he has a bit of swelling in the right calf with a negative Homans' sign, negative Thompson's test, good motion, good strength, able to stand, as well as hop up and down on the affected extremity with assistance with balance. I think he did have a gastrocnemius strain musculotendinous junction that is now healed, he will do some calf conditioning, I have advised elevation of the leg, no DVT ultrasound needed. I have advised that he ambulate with an assistive device considering his imbalance, I have also suggested vestibular and balance physical therapy, family will think about it. Return to see me as needed.    ____________________________________________ Debby PARAS. Curtis, M.D., ABFM., CAQSM., AME. Primary Care and Sports Medicine [facility]  Adjunct Professor of [facility] Medicine  [employer]  Restaurant manager, fast food

## 2024-07-18 ENCOUNTER — Encounter: Payer: Self-pay | Admitting: Sports Medicine
